# Patient Record
Sex: Male | Born: 1990 | Race: White | Hispanic: No | Marital: Single | Smoking: Never smoker
Health system: Southern US, Community
[De-identification: ages and names within clinical notes are randomized; demographics above are authoritative.]

---
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
## 2015-05-06 ENCOUNTER — Encounter (HOSPITAL_COMMUNITY): Payer: Self-pay | Admitting: Emergency Medicine

## 2015-05-06 ENCOUNTER — Emergency Department (HOSPITAL_COMMUNITY)
Admission: EM | Admit: 2015-05-06 | Discharge: 2015-05-06 | Disposition: A | Discharge status: Home / Self Care | Payer: 59 | Attending: Emergency Medicine | Admitting: Emergency Medicine

## 2015-05-06 ENCOUNTER — Emergency Department (HOSPITAL_COMMUNITY): Payer: 59

## 2015-05-06 DIAGNOSIS — Z880 Allergy status to penicillin: Secondary | ICD-10-CM | POA: Insufficient documentation

## 2015-05-06 DIAGNOSIS — R0789 Other chest pain: Secondary | ICD-10-CM | POA: Insufficient documentation

## 2015-05-06 DIAGNOSIS — R0981 Nasal congestion: Secondary | ICD-10-CM | POA: Diagnosis not present

## 2015-05-06 DIAGNOSIS — R05 Cough: Secondary | ICD-10-CM | POA: Insufficient documentation

## 2015-05-06 DIAGNOSIS — Z79899 Other long term (current) drug therapy: Secondary | ICD-10-CM | POA: Insufficient documentation

## 2015-05-06 DIAGNOSIS — R5383 Other fatigue: Secondary | ICD-10-CM | POA: Insufficient documentation

## 2015-05-06 LAB — BASIC METABOLIC PANEL
ANION GAP: 12 (ref 5–15)
BUN: 8 mg/dL (ref 6–20)
CALCIUM: 10.3 mg/dL (ref 8.9–10.3)
CO2: 26 mmol/L (ref 22–32)
CREATININE: 0.97 mg/dL (ref 0.61–1.24)
Chloride: 105 mmol/L (ref 101–111)
Glucose, Bld: 82 mg/dL (ref 65–99)
Potassium: 3.7 mmol/L (ref 3.5–5.1)
Sodium: 143 mmol/L (ref 135–145)

## 2015-05-06 LAB — CBC
HCT: 48.4 % (ref 39.0–52.0)
HEMOGLOBIN: 17.4 g/dL — AB (ref 13.0–17.0)
MCH: 30.8 pg (ref 26.0–34.0)
MCHC: 36 g/dL (ref 30.0–36.0)
MCV: 85.7 fL (ref 78.0–100.0)
PLATELETS: 219 10*3/uL (ref 150–400)
RBC: 5.65 MIL/uL (ref 4.22–5.81)
RDW: 12.5 % (ref 11.5–15.5)
WBC: 10.2 10*3/uL (ref 4.0–10.5)

## 2015-05-06 LAB — I-STAT TROPONIN, ED
Troponin i, poc: 0 ng/mL (ref 0.00–0.08)
Troponin i, poc: 0 ng/mL (ref 0.00–0.08)

## 2015-05-06 NOTE — ED Notes (Signed)
Pt departed in NAD.  

## 2015-05-06 NOTE — ED Notes (Signed)
Pt reports that he started feeling light headed and like his heart was racing this morning, pt went to work this morning and had to come home bc he felt increasingly worse. Pt took a nap and when he woke up he had left sided CP radiating to his arm. Pt alert x4. NAD at this time.

## 2015-05-06 NOTE — ED Provider Notes (Signed)
CSN: [id number]     Arrival date & time 05/06/15  1726 History   First MD Initiated Contact with Patient 05/06/15 1924     Chief Complaint  Patient presents with  . Chest Pain     (Consider location/radiation/quality/duration/timing/severity/associated sxs/prior Treatment) The history is provided by the patient and medical records. No language interpreter was used.   Mike Calderon is a 25 y.o. male  with no pertinent PMH who presents to the Emergency Department complaining of left upper chest pain x 2 days. Patient states pain is intermittent, lasting 4-5 minutes. Associated symptoms include cough/congestion/fatigue. Numbness of left arm when he awoke from sleep, which has since resolved. Pt. Denies fever, sob. No medications taken PTA. Not a smoker, no family hx of heart dz, no known htn/heart dz/lung dz.   History reviewed. No pertinent past medical history. History reviewed. No pertinent past surgical history. No family history on file. Social History  Substance Use Topics  . Smoking status: Never Smoker   . Smokeless tobacco: None  . Alcohol Use: No    Review of Systems  Constitutional: Negative for fever and chills.  HENT: Positive for congestion. Negative for trouble swallowing.   Eyes: Negative for visual disturbance.  Respiratory: Positive for cough. Negative for shortness of breath and wheezing.   Cardiovascular: Positive for chest pain. Negative for palpitations and leg swelling.  Gastrointestinal: Negative for nausea, vomiting and abdominal pain.  Genitourinary: Negative for dysuria.  Musculoskeletal: Negative for back pain and neck pain.  Skin: Negative for rash.  Neurological: Negative for dizziness, weakness and headaches.      Allergies  Amoxicillin  Home Medications   Prior to Admission medications   Medication Sig Start Date End Date Taking? Authorizing Provider  acetaminophen (TYLENOL) 500 MG tablet Take 1,000 mg by mouth every 6 (six) hours as  needed.   Yes Historical Provider, MD  ibuprofen (ADVIL,MOTRIN) 200 MG tablet Take 400 mg by mouth every 6 (six) hours as needed.   Yes Historical Provider, MD  OVER THE COUNTER MEDICATION Take 1 Package by mouth daily. Herbal drink mix   Yes Historical Provider, MD  Phenylephrine-APAP-Guaifenesin (MUCINEX FAST-MAX) 10-650-400 MG/20ML LIQD Take 30 mLs by mouth daily as needed (for mucus).   Yes Historical Provider, MD   BP 119/78 mmHg  Pulse 59  Temp(Src) 98.3 F (36.8 C) (Oral)  Resp 15  SpO2 97% Physical Exam  Constitutional: He is oriented to person, place, and time. He appears well-developed and well-nourished.  Alert and in no acute distress  HENT:  Head: Normocephalic and atraumatic.  Cardiovascular: Normal rate, regular rhythm, normal heart sounds and intact distal pulses.  Exam reveals no gallop and no friction rub.   No murmur heard. Pulmonary/Chest: Effort normal and breath sounds normal. No respiratory distress. He has no wheezes. He has no rales. He exhibits no tenderness.    Abdominal: Soft. Bowel sounds are normal. He exhibits no distension and no mass. There is no tenderness. There is no rebound and no guarding.  Musculoskeletal: He exhibits no edema.  Neurological: He is alert and oriented to person, place, and time.  Skin: Skin is warm and dry. No rash noted.  Psychiatric: He has a normal mood and affect. His behavior is normal. Judgment and thought content normal.  Nursing note and vitals reviewed.   ED Course  Procedures (including critical care time) Labs Review Labs Reviewed  CBC - Abnormal; Notable for the following:    Hemoglobin 17.4 (*)  All other components within normal limits  BASIC METABOLIC PANEL  I-STAT TROPOININ, ED  I-STAT TROPOININ, ED    Imaging Review Dg Chest 2 View  05/06/2015  CLINICAL DATA:  One-day history of left chest pain and shortness of breath with dry cough and congestion. EXAM: CHEST  2 VIEW COMPARISON:  None. FINDINGS: The  heart size and mediastinal contours are within normal limits. Both lungs are clear. The visualized skeletal structures are unremarkable. IMPRESSION: Normal exam. Electronically Signed   By: Kennith Center M.D.   On: 05/06/2015 18:11   I have personally reviewed and evaluated these images and lab results as part of my medical decision-making.   EKG Interpretation   Date/Time:  Wednesday May 06 2015 17:31:14 EST Ventricular Rate:  103 PR Interval:  134 QRS Duration: 88 QT Interval:  370 QTC Calculation: 484 R Axis:   87 Text Interpretation:  Sinus tachycardia Biatrial enlargement Abnormal ECG  No old tracing to compare Confirmed by [facility]  MD, DAVID ([id number]) on  05/06/2015 5:48:16 PM     MDM   Final diagnoses:  Atypical chest pain   Mike Calderon presents with left sided chest pain. PERC negative, low risk on HEART score. Pt. States he only wants to take medications that are 100% mandatory.  Patient is to be discharged with recommendation to follow up with cardiology in regards to today's hospital visit. Patient's symptoms unlikely to be of CAD etiology and patient states chest pain has improved while in my care. Labs and imaging reviewed again prior to dc. CXR with no acute abnormalities, and neg troponins x2. Patient has been advised return to the ED for any exertional chest pain. Strict return precautions discussed & patient's questions answered. Home care instructions including NSAID's given. Pt appears reliable for follow up and is agreeable to discharge.   [facility] Marynell Bies, PA-C 05/06/15 2233  Dione Booze, MD 05/07/15 6045  Dione Booze, MD 05/07/15 [phone]

## 2015-05-06 NOTE — Discharge Instructions (Signed)
Please follow up with your primary doctor in 1-3  days for discussion of your diagnoses and further evaluation after today's visit; if you do not have a primary care doctor use the resource guide provided to find one; Please return to the ER for new or worsening symptoms, any additional concerns.  Take ibuprofen or motrin as needed for pain, ice affected area  COLD THERAPY DIRECTIONS:  Ice or gel packs can be used to reduce both pain and swelling. Ice is the most helpful within the first 24 to 48 hours after an injury or flareup from overusing a muscle or joint.  Ice is effective, has very few side effects, and is safe for most people to use.   If you expose your skin to cold temperatures for too long or without the proper protection, you can damage your skin or nerves. Watch for signs of skin damage due to cold.   HOME CARE INSTRUCTIONS  Follow these tips to use ice and cold packs safely.  Place a dry or damp towel between the ice and skin. A damp towel will cool the skin more quickly, so you may need to shorten the time that the ice is used.  For a more rapid response, add gentle compression to the ice.  Ice for no more than 10 to 20 minutes at a time. The bonier the area you are icing, the less time it will take to get the benefits of ice.  Check your skin after 5 minutes to make sure there are no signs of a poor response to cold or skin damage.  Rest 20 minutes or more in between uses.  Once your skin is numb, you can end your treatment. You can test numbness by very lightly touching your skin. The touch should be so light that you do not see the skin dimple from the pressure of your fingertip. When using ice, most people will feel these normal sensations in this order: cold, burning, aching, and numbness.    Emergency Department Resource Guide 1) Find a Doctor and Pay Out of Pocket Although you won't have to find out who is covered by your insurance plan, it is a good idea to ask around and  get recommendations. You will then need to call the office and see if the doctor you have chosen will accept you as a new patient and what types of options they offer for patients who are self-pay. Some doctors offer discounts or will set up payment plans for their patients who do not have insurance, but you will need to ask so you aren't surprised when you get to your appointment.  2) Contact Your Local Health Department Not all health departments have doctors that can see patients for sick visits, but many do, so it is worth a call to see if yours does. If you don't know where your local health department is, you can check in your phone book. The CDC also has a tool to help you locate your state's health department, and many state websites also have listings of all of their local health departments.  3) Find a Walk-in Clinic If your illness is not likely to be very severe or complicated, you may want to try a walk in clinic. These are popping up all over the country in pharmacies, drugstores, and shopping centers. They're usually staffed by nurse practitioners or physician assistants that have been trained to treat common illnesses and complaints. They're usually fairly quick and inexpensive. However, if you  have serious medical issues or chronic medical problems, these are probably not your best option.  No Primary Care Doctor: - Call Health Connect at  [phone] - they can help you locate a primary care doctor that  accepts your insurance, provides certain services, etc. - Physician Referral Service- [phone]  Chronic Pain Problems: Organization         Address  Phone   Notes  [facility]  [phone] Patients need to be referred by their primary care doctor.   Medication Assistance: Organization         Address  Phone   Notes  [facility] Medication [facility] [address] [address] [place], [place] [address] [phone] --Must be a resident of  [facility] -- Must have NO insurance coverage whatsoever (no Medicaid/ Medicare, etc.) -- The pt. MUST have a primary care doctor that directs their care regularly and follows them in the community   [facility]  [phone]   [employer]  [phone]    Agencies that provide inexpensive medical care: Organization         Address  Phone   Notes  [place]  [phone]   [facility]    [phone]   [facility] [place], [place] [address] [phone]   [place] [address] [place] [phone]   [facility]    [phone]   [facility]    [phone]   [place] [address] [place], [place] Phone:  [phone], Fax:  [phone] Hours of Operation:  9 am - 6 pm, M-F.  Also accepts Medicaid/Medicare and self-pay.  [facility] for [place], [address], [place] Phone: [phone], Fax: [phone]. Hours of Operation:  8:30 am - 5:30 pm, M-F.  Also accepts Medicaid and self-pay.  [facility] [address], [place] Phone: [phone]   [place], [place], [place] [phone], Ext. [phone] Mondays & Thursdays: 7-9 AM.  First 15 patients are seen on a first come, first serve basis.    [place] Providers:  Organization         Address  Phone   Notes  [facility] [address], [place] [phone] Also accepts self-pay patients.  [facility] [address], [place]  [phone]   [place], [address], [place] [phone]   [facility] [address], [place] [phone]   Lucianne Lei [address], [place]   [phone] Only accepts [place] Access  [place] patients after they have their name applied to their card.   Self-Pay (no insurance) in [facility]:  Organization         Address  Phone   Notes  Sickle Cell Patients, [place] [facility] [place] [phone]   [facility] [place] [phone]   [facility] [place]  [place], [place], [place] [phone]   [facility]/Dr. Osei-Bonsu  [address] [place] or [address], [place] [phone] Phone number for both [employer] and [place]  locations is the same.  [facility] [address] [place] [phone]   [facility] [address], [place] or [address] [phone]   [facility] [address] [place] [phone], phone; [phone], fax Sees patients 1st and 3rd Saturday of every month.  Must not qualify for public or private insurance (i.e. Medicaid, Medicare, [place] Health Choice, Veterans' Benefits)  Household income should be no more than 200% of the poverty level The clinic cannot treat you if you are pregnant or think you are pregnant  Sexually transmitted diseases are not treated at the clinic.

## 2017-06-27 ENCOUNTER — Ambulatory Visit: Payer: Self-pay | Admitting: Allergy and Immunology

## 2020-04-21 ENCOUNTER — Other Ambulatory Visit: Payer: Self-pay

## 2020-04-21 ENCOUNTER — Encounter (HOSPITAL_BASED_OUTPATIENT_CLINIC_OR_DEPARTMENT_OTHER): Payer: Self-pay | Admitting: *Deleted

## 2020-04-21 DIAGNOSIS — J3489 Other specified disorders of nose and nasal sinuses: Secondary | ICD-10-CM | POA: Insufficient documentation

## 2020-04-21 DIAGNOSIS — R531 Weakness: Secondary | ICD-10-CM | POA: Insufficient documentation

## 2020-04-21 DIAGNOSIS — F419 Anxiety disorder, unspecified: Secondary | ICD-10-CM | POA: Diagnosis not present

## 2020-04-21 DIAGNOSIS — G43A1 Cyclical vomiting, intractable: Secondary | ICD-10-CM | POA: Insufficient documentation

## 2020-04-21 DIAGNOSIS — R042 Hemoptysis: Secondary | ICD-10-CM | POA: Insufficient documentation

## 2020-04-21 DIAGNOSIS — R109 Unspecified abdominal pain: Secondary | ICD-10-CM | POA: Diagnosis not present

## 2020-04-21 DIAGNOSIS — R509 Fever, unspecified: Secondary | ICD-10-CM | POA: Diagnosis not present

## 2020-04-21 DIAGNOSIS — R059 Cough, unspecified: Secondary | ICD-10-CM | POA: Diagnosis present

## 2020-04-21 DIAGNOSIS — R0981 Nasal congestion: Secondary | ICD-10-CM | POA: Insufficient documentation

## 2020-04-21 DIAGNOSIS — R079 Chest pain, unspecified: Secondary | ICD-10-CM | POA: Diagnosis not present

## 2020-04-21 DIAGNOSIS — R0602 Shortness of breath: Secondary | ICD-10-CM | POA: Insufficient documentation

## 2020-04-21 NOTE — ED Triage Notes (Signed)
C/o cough x 1 month , covid x 1 month ago , chest wall pain with coughing

## 2020-04-22 ENCOUNTER — Emergency Department (HOSPITAL_BASED_OUTPATIENT_CLINIC_OR_DEPARTMENT_OTHER)
Admission: EM | Admit: 2020-04-22 | Discharge: 2020-04-22 | Disposition: A | Discharge status: Home / Self Care | Payer: 59 | Attending: Emergency Medicine | Admitting: Emergency Medicine

## 2020-04-22 ENCOUNTER — Encounter (HOSPITAL_BASED_OUTPATIENT_CLINIC_OR_DEPARTMENT_OTHER): Payer: Self-pay | Admitting: Emergency Medicine

## 2020-04-22 ENCOUNTER — Emergency Department (HOSPITAL_BASED_OUTPATIENT_CLINIC_OR_DEPARTMENT_OTHER): Payer: 59

## 2020-04-22 ENCOUNTER — Other Ambulatory Visit: Payer: Self-pay

## 2020-04-22 DIAGNOSIS — R059 Cough, unspecified: Secondary | ICD-10-CM

## 2020-04-22 DIAGNOSIS — F419 Anxiety disorder, unspecified: Secondary | ICD-10-CM

## 2020-04-22 NOTE — ED Provider Notes (Signed)
[facility] EMERGENCY DEPARTMENT Provider Note   CSN: [id number] Arrival date & time: 04/21/20  2352     History Chief Complaint  Patient presents with  . Cough    Mike Calderon is a 30 y.o. male.  The history is provided by the patient.  Cough Cough characteristics:  Non-productive Severity:  Mild Onset quality:  Gradual Duration:  4 weeks Timing:  Intermittent Progression:  Unchanged Chronicity:  New Context: upper respiratory infection   Relieved by:  Nothing Worsened by:  Nothing Ineffective treatments:  None tried Associated symptoms: fever, rhinorrhea and sinus congestion   Associated symptoms: no chest pain and no shortness of breath   Associated symptoms comment:  A month ago, has pain when he coughs Risk factors: no chemical exposure   States he thought he had a sinus infection one month ago.  Then developed cough and fevers.  Was never tested for covid but thinks he may have had it.  He has seen his doctor multiple times and was prescribed antibiotics that he did not fill or start taking until today.  He states he has a warm feeling all over.  He denies anxiety though he repeatedly says he is afraid something is wrong as symptoms have not resolved.      History reviewed. No pertinent past medical history.  There are no problems to display for this patient.   History reviewed. No pertinent surgical history.     History reviewed. No pertinent family history.  Social History   Tobacco Use  . Smoking status: Never Smoker  . Smokeless tobacco: Never Used  Substance Use Topics  . Alcohol use: No  . Drug use: No    Home Medications Prior to Admission medications   Medication Sig Start Date End Date Taking? Authorizing Provider  acetaminophen (TYLENOL) 500 MG tablet Take 1,000 mg by mouth every 6 (six) hours as needed.    [provider]  ibuprofen (ADVIL,MOTRIN) 200 MG tablet Take 400 mg by mouth every 6 (six) hours as needed.     [provider]  OVER THE COUNTER MEDICATION Take 1 Package by mouth daily. Herbal drink mix    [provider]  Phenylephrine-APAP-Guaifenesin (MUCINEX FAST-MAX) 10-650-400 MG/20ML LIQD Take 30 mLs by mouth daily as needed (for mucus).    [provider]    Allergies    Amoxicillin  Review of Systems   Review of Systems  Constitutional: Positive for fever.  HENT: Positive for congestion and rhinorrhea.   Eyes: Negative for visual disturbance.  Respiratory: Positive for cough. Negative for shortness of breath.   Cardiovascular: Negative for chest pain, palpitations and leg swelling.  Gastrointestinal: Negative for abdominal pain.  Genitourinary: Negative for difficulty urinating.  Musculoskeletal: Negative for arthralgias.  Neurological: Negative for dizziness.  Psychiatric/Behavioral: The patient is nervous/anxious.   All other systems reviewed and are negative.   Physical Exam Updated Vital Signs BP (!) 146/99   Pulse 76   Temp 98.2 F (36.8 C) (Oral)   Resp 16   Ht 5\' 11"  (1.803 m)   Wt 77.1 kg   SpO2 100%   BMI 23.71 kg/m   Physical Exam Vitals and nursing note reviewed.  Constitutional:      General: He is not in acute distress.    Appearance: Normal appearance. He is not diaphoretic.  HENT:     Head: Normocephalic and atraumatic.     Nose: Nose normal.  Eyes:     Conjunctiva/sclera: Conjunctivae normal.  Pupils: Pupils are equal, round, and reactive to light.  Cardiovascular:     Rate and Rhythm: Normal rate and regular rhythm.     Pulses: Normal pulses.     Heart sounds: Normal heart sounds.  Pulmonary:     Effort: Pulmonary effort is normal. No respiratory distress.     Breath sounds: Normal breath sounds. No wheezing or rales.  Abdominal:     General: Abdomen is flat. Bowel sounds are normal.     Palpations: Abdomen is soft.  Musculoskeletal:        General: No tenderness. Normal range of motion.     Cervical back:  Normal range of motion and neck supple.     Right lower leg: No edema.     Left lower leg: No edema.     Comments: Negative Homan's sign  Skin:    General: Skin is warm and dry.     Capillary Refill: Capillary refill takes less than 2 seconds.  Neurological:     General: No focal deficit present.     Mental Status: He is alert and oriented to person, place, and time.     Deep Tendon Reflexes: Reflexes normal.  Psychiatric:        Mood and Affect: Mood is anxious.     Comments: tremulous     ED Results / Procedures / Treatments   Labs (all labs ordered are listed, but only abnormal results are displayed) Labs Reviewed - No data to display  EKG EKG Interpretation  Date/Time:  Wednesday April 22 2020 01:24:20 EST Ventricular Rate:  63 PR Interval:    QRS Duration: 94 QT Interval:  386 QTC Calculation: 396 R Axis:   65 Text Interpretation: Sinus rhythm Confirmed by Nicanor Alcon, Koston Hennes ([id number]) on 04/22/2020 1:28:38 AM   Radiology DG Chest 2 View  Result Date: 04/22/2020 CLINICAL DATA:  Cough EXAM: CHEST - 2 VIEW COMPARISON:  May 06, 2015 FINDINGS: The heart size and mediastinal contours are within normal limits. Both lungs are clear. The visualized skeletal structures are unremarkable. IMPRESSION: No active cardiopulmonary disease. Electronically Signed   By: Jonna Clark M.D.   On: 04/22/2020 00:12    Procedures Procedures (including critical care time)  Medications Ordered in ED Medications - No data to display  ED Course  I have reviewed the triage vital signs and the nursing notes.  Pertinent labs & imaging results that were available during my care of the patient were reviewed by me and considered in my medical decision making (see chart for details).    PERC negative wells 0 highly doubt PE in this low risk patient.  I suspect the patient may have long symptoms from covid.  I have offered a covid swab to ensure that he does not have covid at this time but he  declines.  The chest Xray is normal.  There are no signs of pericarditis.  He is well appearing with normal exam and vitals.  Every time I reassure the patient he comes up with a new concern about something he read on the internet. He then clearly begins hyperventilating.  I have instructed the patient to slow his breathing.  I do not believe labs or advanced imaging are necessary and I believe the more we do the more he will come up with new issues.  He is stable for discharge with close follow up.    Mike Calderon was evaluated in Emergency Department on 04/22/2020 for the symptoms described in the history  of present illness. He was evaluated in the context of the global COVID-19 pandemic, which necessitated consideration that the patient might be at risk for infection with the SARS-CoV-2 virus that causes COVID-19. Institutional protocols and algorithms that pertain to the evaluation of patients at risk for COVID-19 are in a state of rapid change based on information released by regulatory bodies including the CDC and federal and state organizations. These policies and algorithms were followed during the patient's care in the ED.  Final Clinical Impression(s) / ED Diagnoses Return for intractable cough, coughing up blood, fevers > 100.4 unrelieved by medication, shortness of breath, intractable vomiting, chest pain, shortness of breath, weakness, numbness, changes in speech, facial asymmetry, abdominal pain, passing out, Inability to tolerate liquids or food, cough, altered mental status or any concerns. No signs of systemic illness or infection. The patient is nontoxic-appearing on exam and vital signs are within normal limits.  I have reviewed the triage vital signs and the nursing notes. Pertinent labs & imaging results that were available during my care of the patient were reviewed by me and considered in my medical decision making (see chart for details). After history, exam, and medical workup I  feel the patient has been appropriately medically screened and is safe for discharge home. Pertinent diagnoses were discussed with the patient. Patient was given return precautions.     Romina Divirgilio, MD 04/22/20 [phone]

## 2022-01-11 DIAGNOSIS — Z6822 Body mass index (BMI) 22.0-22.9, adult: Secondary | ICD-10-CM | POA: Diagnosis not present

## 2022-01-11 DIAGNOSIS — J309 Allergic rhinitis, unspecified: Secondary | ICD-10-CM | POA: Diagnosis not present

## 2022-05-15 IMAGING — DX DG CHEST 2V
2 series · 2 of 2 positions shown · non-contrast
Comparison: May 06, 2015

CLINICAL DATA: Cough

EXAM:
CHEST - 2 VIEW

[chest pa]
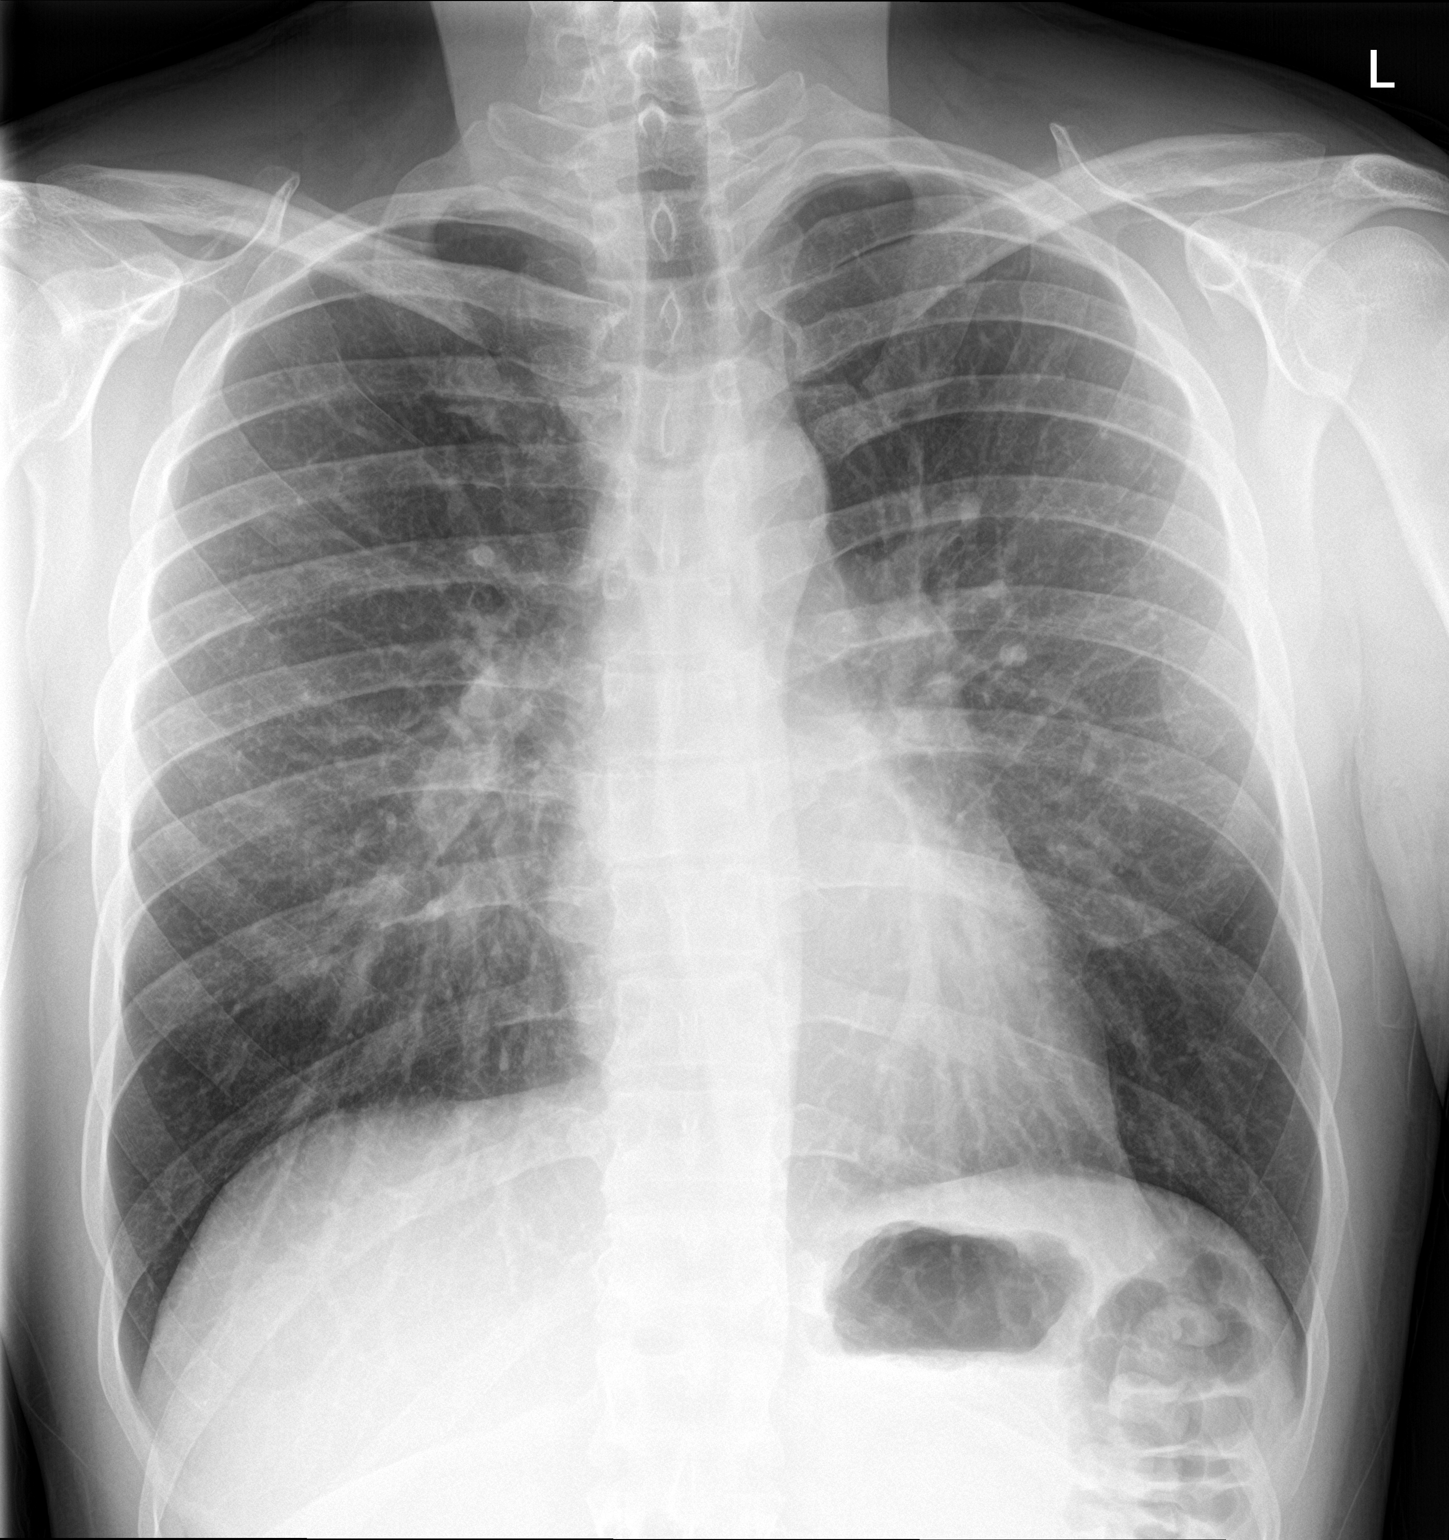

[chest lat]
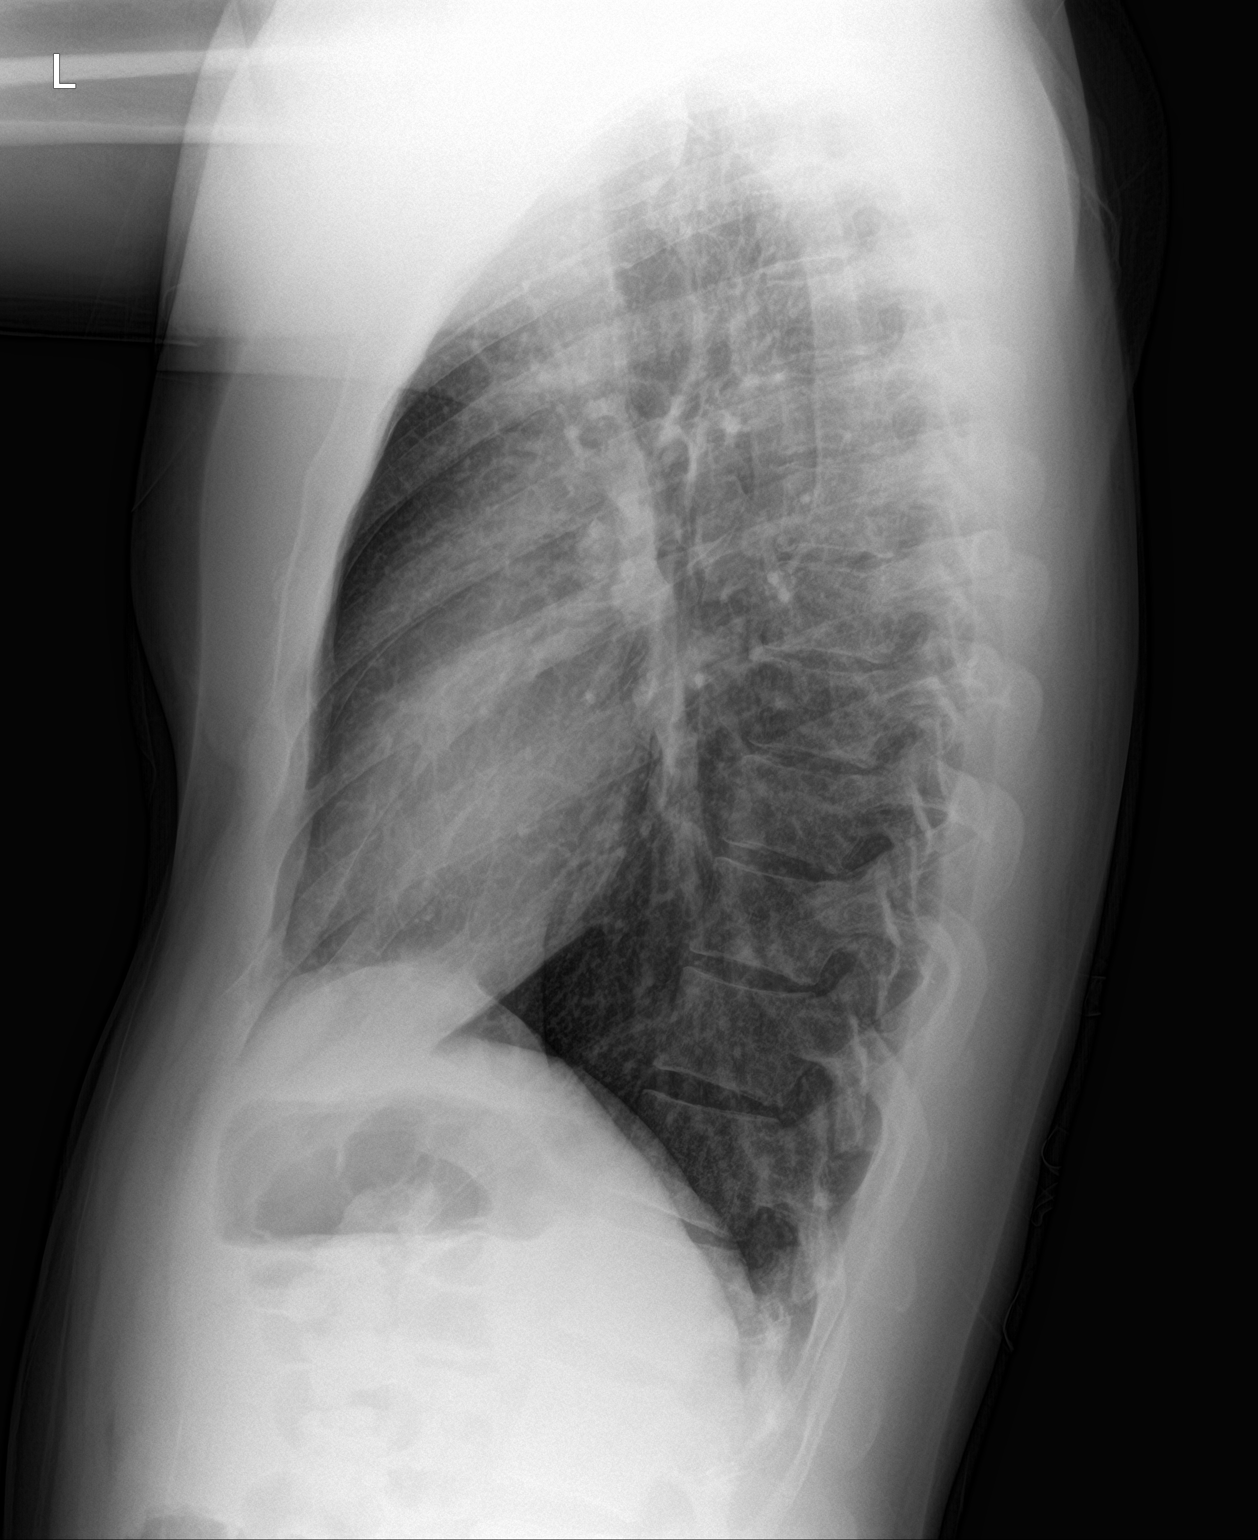

[2 of 2 positions shown; findings below may reference images not displayed]

FINDINGS: The heart size and mediastinal contours are within normal limits.
Both lungs are clear. The visualized skeletal structures are
unremarkable.
IMPRESSION: No active cardiopulmonary disease.
